# Patient Record
Sex: Female | Born: 1990 | Race: White | Hispanic: No | Marital: Single
Health system: Southern US, Community
[De-identification: ages and names within clinical notes are randomized; demographics above are authoritative.]

---
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
## 2009-03-23 ENCOUNTER — Emergency Department (HOSPITAL_COMMUNITY): Admission: EM | Admit: 2009-03-23 | Discharge: 2009-03-24 | Payer: Self-pay | Admitting: Emergency Medicine

## 2010-01-08 IMAGING — CT CT CERVICAL SPINE W/O CM
4 of 5 series · 15 of 33 positions shown, 18 images · non-contrast
Comparison: None.

CLINICAL DATA: Posterior neck stiffness following a fall today as a
result of syncope.

CT CERVICAL SPINE WITHOUT CONTRAST
TECHNIQUE: Multidetector CT imaging of the cervical spine was
performed. Multiplanar CT image reconstructions were also
generated.

[Series 5: c_spine 2.0 b41s detail · axial · 0.31mm/px · z∈[+1131,+1267]mm · 5 of 104 slices shown, 7 images]
[im 18/104  soft-tissue]
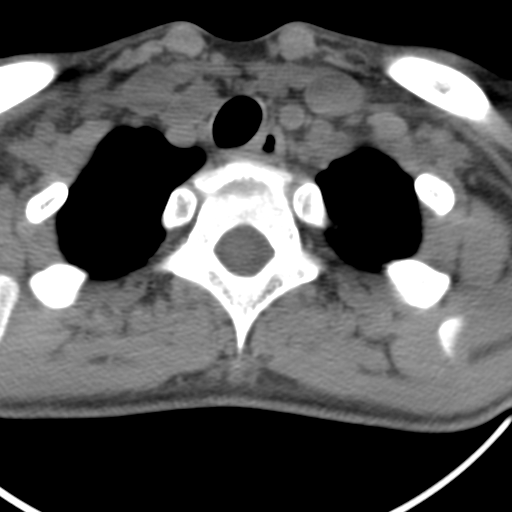
[im 18/104  bone]
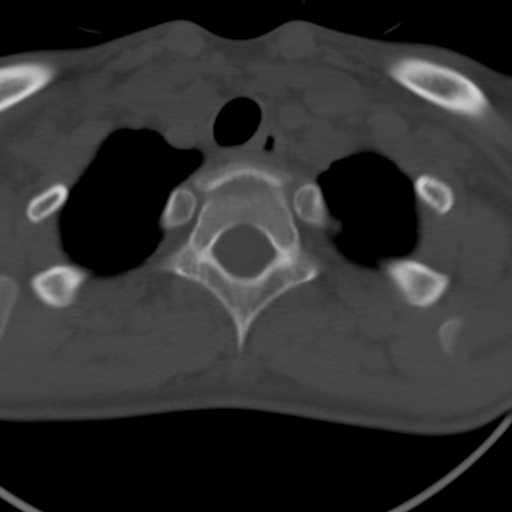
[im 35/104  bone]
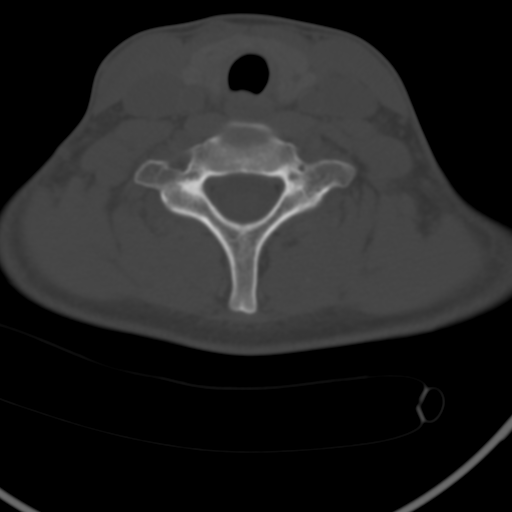
[im 52/104  bone]
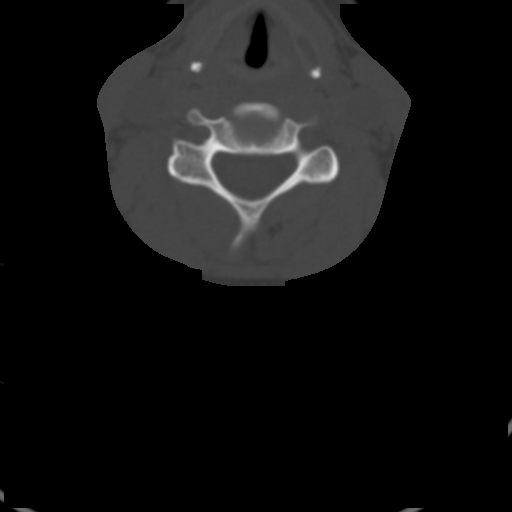
[im 69/104  bone]
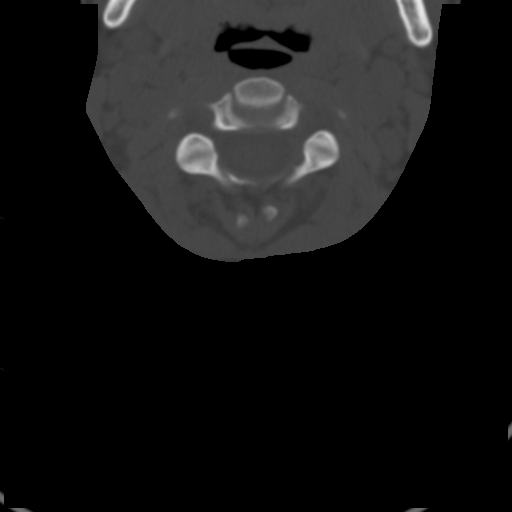
[im 86/104  soft-tissue]
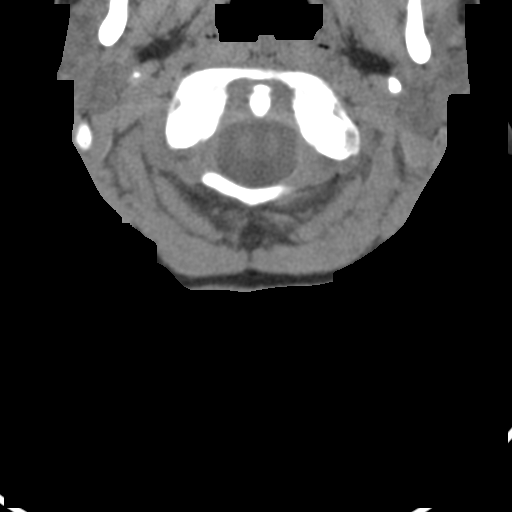
[im 86/104  bone]
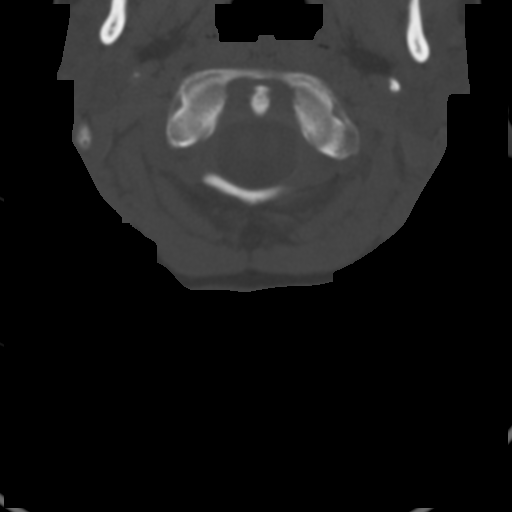

[Series 602: coronal · coronal · 0.41mm/px · 3 of 40 slices shown]
[im 8/40  bone]
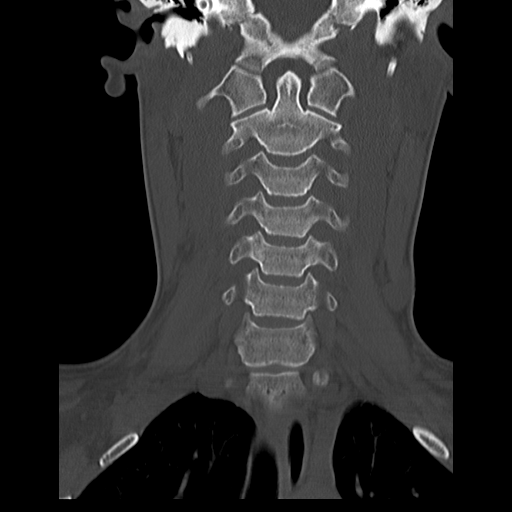
[im 16/40  bone]
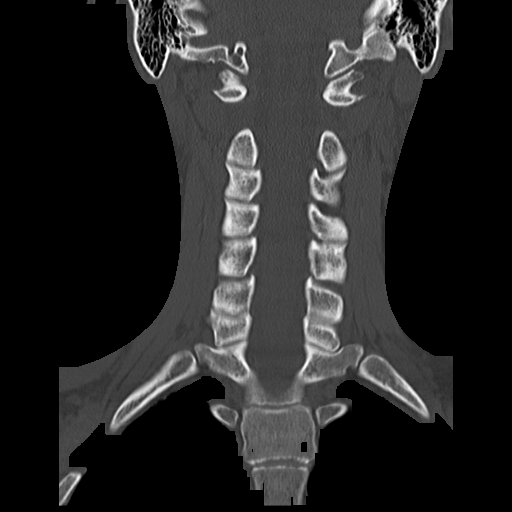
[im 24/40  bone]
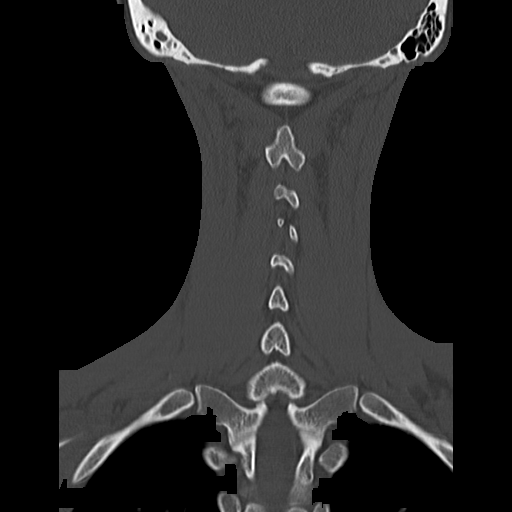

[Series 603: sag · sagittal · 0.41mm/px · 5 of 46 slices shown, 6 images]
[im 16/46  bone]
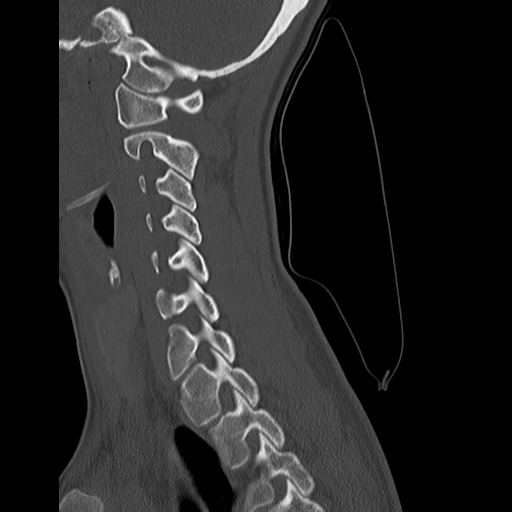
[im 19/46  bone]
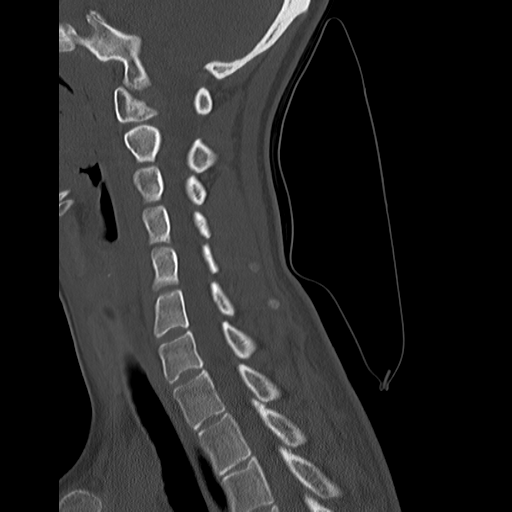
[im 23/46  soft-tissue]
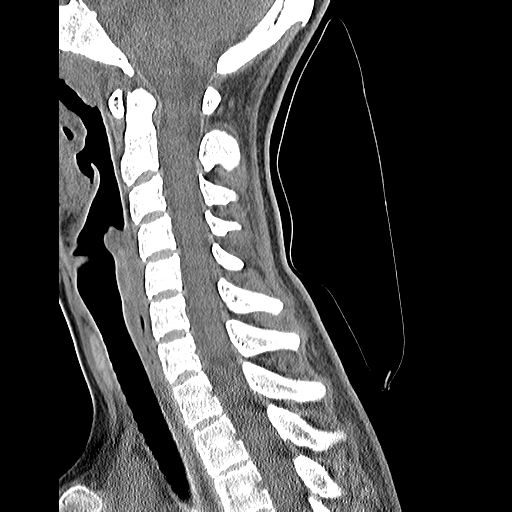
[im 23/46  bone]
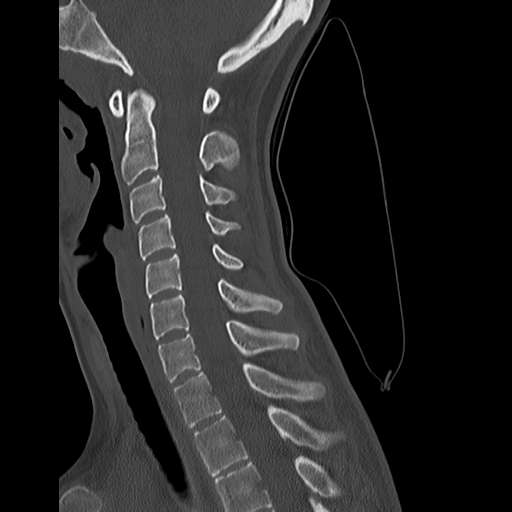
[im 27/46  bone]
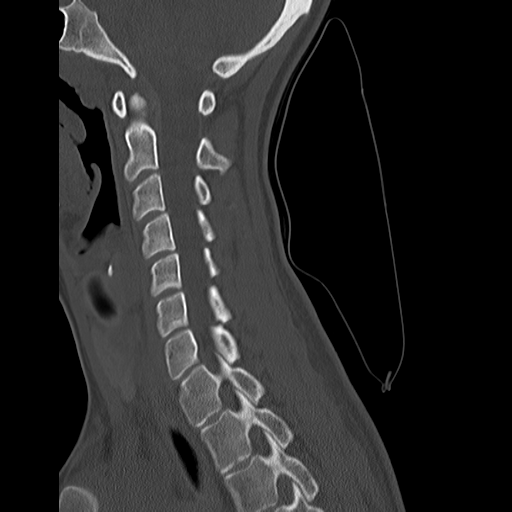
[im 31/46  bone]
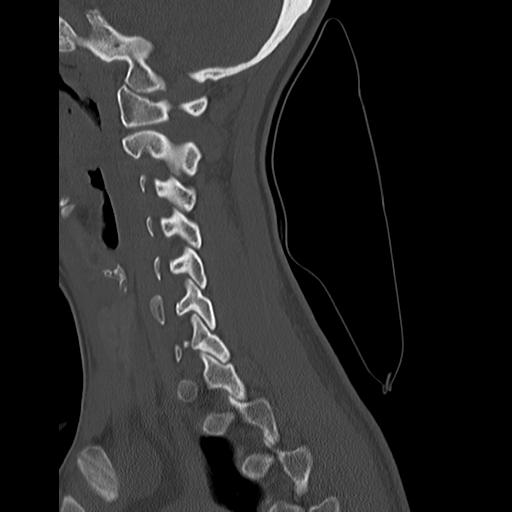

[Series 604: upper axial · axial · 0.41mm/px · z∈[+1192,+1235]mm · 2 of 68 slices shown]
[im 23/68  bone]
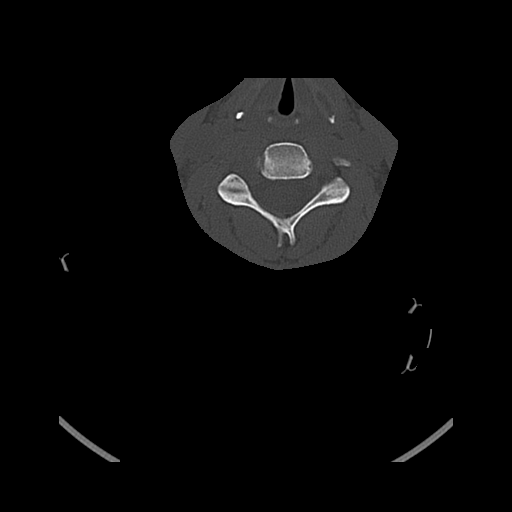
[im 45/68  bone]
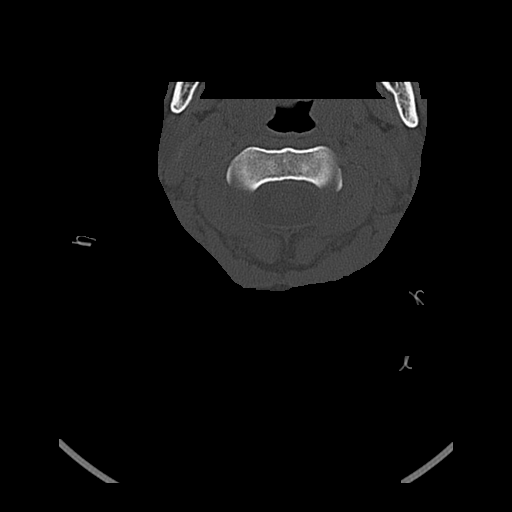

[15 of 33 positions shown; findings below may reference images not displayed]

FINDINGS: Minimal reversal of the normal lordosis in the mid
cervical spine.  No prevertebral soft tissue swelling, fractures or
subluxations.
IMPRESSION: Minimal reversal of the normal cervical lordosis.  No fracture or
subluxation.

## 2011-01-27 LAB — DIFFERENTIAL
Basophils Absolute: 0 10*3/uL (ref 0.0–0.1)
Basophils Relative: 0 % (ref 0–1)
Eosinophils Absolute: 0.1 10*3/uL (ref 0.0–0.7)
Eosinophils Relative: 2 % (ref 0–5)
Lymphocytes Relative: 44 % (ref 12–46)
Lymphs Abs: 3.5 10*3/uL (ref 0.7–4.0)
Monocytes Absolute: 1 10*3/uL (ref 0.1–1.0)
Monocytes Relative: 12 % (ref 3–12)
Neutro Abs: 3.4 10*3/uL (ref 1.7–7.7)
Neutrophils Relative %: 42 % — ABNORMAL LOW (ref 43–77)

## 2011-01-27 LAB — CBC
HCT: 32.4 % — ABNORMAL LOW (ref 36.0–46.0)
Hemoglobin: 10.8 g/dL — ABNORMAL LOW (ref 12.0–15.0)
MCHC: 33.4 g/dL (ref 30.0–36.0)
MCV: 83.4 fL (ref 78.0–100.0)
Platelets: 195 10*3/uL (ref 150–400)
RBC: 3.88 MIL/uL (ref 3.87–5.11)
RDW: 15.9 % — ABNORMAL HIGH (ref 11.5–15.5)
WBC: 8 10*3/uL (ref 4.0–10.5)

## 2011-01-27 LAB — URINALYSIS, ROUTINE W REFLEX MICROSCOPIC
Bilirubin Urine: NEGATIVE
Hgb urine dipstick: NEGATIVE
Nitrite: NEGATIVE
Specific Gravity, Urine: 1.02 (ref 1.005–1.030)
pH: 8.5 — ABNORMAL HIGH (ref 5.0–8.0)

## 2011-01-27 LAB — POCT I-STAT, CHEM 8
Calcium, Ion: 1.11 mmol/L — ABNORMAL LOW (ref 1.12–1.32)
Glucose, Bld: 97 mg/dL (ref 70–99)
HCT: 33 % — ABNORMAL LOW (ref 36.0–46.0)
TCO2: 21 mmol/L (ref 0–100)

## 2011-01-27 LAB — URINE CULTURE

## 2011-01-27 LAB — POCT PREGNANCY, URINE: Preg Test, Ur: NEGATIVE

## 2021-08-27 ENCOUNTER — Telehealth: Payer: Self-pay

## 2022-08-04 NOTE — Telephone Encounter (Signed)
This visit is complete
